# Patient Record
Sex: Female | Born: 1975 | Race: White | Hispanic: No | Marital: Single | Smoking: Never smoker
Health system: Southern US, Community
[De-identification: ages and names within clinical notes are randomized; demographics above are authoritative.]

## PROBLEM LIST (undated history)

## (undated) ENCOUNTER — Emergency Department: Discharge status: Absent without leave | Payer: Self-pay

## (undated) DIAGNOSIS — M1990 Unspecified osteoarthritis, unspecified site: Secondary | ICD-10-CM

## (undated) DIAGNOSIS — J45909 Unspecified asthma, uncomplicated: Secondary | ICD-10-CM

---

## 2018-01-21 ENCOUNTER — Encounter: Payer: Self-pay | Admitting: Emergency Medicine

## 2018-01-21 ENCOUNTER — Emergency Department
Admission: EM | Admit: 2018-01-21 | Discharge: 2018-01-21 | Disposition: A | Discharge status: Home / Self Care | Payer: Self-pay | Source: Home / Self Care

## 2018-01-21 DIAGNOSIS — G43009 Migraine without aura, not intractable, without status migrainosus: Secondary | ICD-10-CM

## 2018-01-21 MED ORDER — SUMATRIPTAN SUCCINATE 6 MG/0.5ML ~~LOC~~ SOLN
6.0000 mg | Freq: Once | SUBCUTANEOUS | Status: AC
  Administered 2018-01-21: 6 mg via SUBCUTANEOUS

## 2018-01-21 MED ORDER — SUMATRIPTAN SUCCINATE 100 MG PO TABS: 100.0000 mg | ORAL_TABLET | ORAL | 0 refills | Status: DC | PRN

## 2018-01-21 NOTE — ED Triage Notes (Signed)
Pt c/o migraine that started this am. States she ran out of her imitrex rx last week. Took tramadol and excedrin today with no relief. Denies light sensitivity but has been having N+V.

## 2018-01-22 NOTE — ED Provider Notes (Signed)
[facility]    CSN: [id number] Arrival date & time: 01/21/18  1554     History   Chief Complaint Chief Complaint  Patient presents with  . Headache    HPI Monica Harmon is a 42 y.o. female.   The history is provided by the patient. No language interpreter was used.  Headache  Pain location:  Generalized Radiates to:  Does not radiate Severity currently:  7/10 Onset quality:  Gradual Timing:  Constant Progression:  Worsening Chronicity:  New Context: not activity   Relieved by:  Nothing Worsened by:  Nothing Associated symptoms: no abdominal pain, no blurred vision, no congestion, no facial pain, no fatigue and no fever     History reviewed. No pertinent past medical history.  There are no active problems to display for this patient.   History reviewed. No pertinent surgical history.  OB History    No data available       Home Medications    Prior to Admission medications   Medication Sig Start Date End Date Taking? Authorizing Provider  SUMAtriptan (IMITREX) 100 MG tablet Take 1 tablet (100 mg total) by mouth every 2 (two) hours as needed for migraine. May repeat in 2 hours if headache persists or recurs. 01/21/18   Elson Areas, PA-C    Family History History reviewed. No pertinent family history.  Social History Social History   Tobacco Use  . Smoking status: Never Smoker  . Smokeless tobacco: Never Used  Substance Use Topics  . Alcohol use: Not on file  . Drug use: Not on file     Allergies   Dexamethasone   Review of Systems Review of Systems  Constitutional: Negative for fatigue and fever.  HENT: Negative for congestion.   Eyes: Negative for blurred vision.  Gastrointestinal: Negative for abdominal pain.  Neurological: Positive for headaches.  All other systems reviewed and are negative.    Physical Exam Triage Vital Signs ED Triage Vitals  Enc Vitals Group     BP 01/21/18 1645 (!) 132/91     Pulse Rate  01/21/18 1645 78     Resp --      Temp 01/21/18 1645 97.8 F (36.6 C)     Temp Source 01/21/18 1645 Oral     SpO2 01/21/18 1645 98 %     Weight 01/21/18 1646 160 lb (72.6 kg)     Height --      Head Circumference --      Peak Flow --      Pain Score 01/21/18 1645 10     Pain Loc --      Pain Edu? --      Excl. in GC? --    No data found.  Updated Vital Signs BP (!) 132/91 (BP Location: Right Arm)   Pulse 78   Temp 97.8 F (36.6 C) (Oral)   Wt 160 lb (72.6 kg)   SpO2 98%   Visual Acuity Right Eye Distance:   Left Eye Distance:   Bilateral Distance:    Right Eye Near:   Left Eye Near:    Bilateral Near:     Physical Exam  Constitutional: She appears well-developed and well-nourished. No distress.  HENT:  Head: Normocephalic and atraumatic.  Eyes: Conjunctivae and EOM are normal. Pupils are equal, round, and reactive to light.  Neck: Normal range of motion. Neck supple.  Cardiovascular: Normal rate and regular rhythm.  No murmur heard. Pulmonary/Chest: Effort normal and breath sounds normal. No  respiratory distress.  Abdominal: Soft. There is no tenderness.  Musculoskeletal: She exhibits no edema.  Neurological: She is alert.  Skin: Skin is warm and dry.  Psychiatric: She has a normal mood and affect.  Nursing note and vitals reviewed.    UC Treatments / Results  Labs (all labs ordered are listed, but only abnormal results are displayed) Labs Reviewed - No data to display  EKG  EKG Interpretation None       Radiology No results found.  Procedures Procedures (including critical care time)  Medications Ordered in UC Medications  SUMAtriptan (IMITREX) injection 6 mg (6 mg Subcutaneous Given 01/21/18 1706)     Initial Impression / Assessment and Plan / UC Course  I have reviewed the triage vital signs and the nursing notes.  Pertinent labs & imaging results that were available during my care of the patient were reviewed by me and considered in my  medical decision making (see chart for details).     Pt request imitrex.  Pt given injection here.  Pt given rx for imitrex tablets.   Final Clinical Impressions(s) / UC Diagnoses   Final diagnoses:  Migraine without aura and without status migrainosus, not intractable    ED Discharge Orders        Ordered    SUMAtriptan (IMITREX) 100 MG tablet  Every 2 hours PRN     01/21/18 1658       Controlled Substance Prescriptions [place] Controlled Substance Registry consulted? Not Applicable  An After Visit Summary was printed and given to the patient.    Elson AreasSofia, Leslie K, [place] JerseyPA-C 01/22/18 [id number]

## 2018-03-13 ENCOUNTER — Emergency Department
Admission: EM | Admit: 2018-03-13 | Discharge: 2018-03-13 | Disposition: A | Discharge status: Home / Self Care | Payer: Self-pay | Source: Home / Self Care | Attending: Family Medicine | Admitting: Family Medicine

## 2018-03-13 ENCOUNTER — Other Ambulatory Visit: Payer: Self-pay

## 2018-03-13 ENCOUNTER — Encounter: Payer: Self-pay | Admitting: Emergency Medicine

## 2018-03-13 DIAGNOSIS — G43009 Migraine without aura, not intractable, without status migrainosus: Secondary | ICD-10-CM

## 2018-03-13 MED ORDER — SUMATRIPTAN SUCCINATE 100 MG PO TABS: 100.0000 mg | ORAL_TABLET | ORAL | 0 refills | Status: AC | PRN

## 2018-03-13 NOTE — ED Triage Notes (Signed)
Pt c/o of migrane that started this morning. She states she is out of her Imitrex for the month of May and will see her Neuro dr in June. She complains of light sensitivity and has been having n+v.

## 2018-03-13 NOTE — ED Provider Notes (Signed)
[name]    CSN: [id number] Arrival date & time: 03/13/18  1137     History   Chief Complaint Chief Complaint  Patient presents with  . Migraine    nausea and vomiting    HPI Monica Harmon is a 42 y.o. female.   HPI  Monica Harmon is a 42 y.o. female presenting to UC with c/o worsening HA c/w prior migraines.  Pain is aching and throbbing in the Right frontal portion, same location as most migraines. Pain is moderate to severe. Associated nausea, vomiting and photophobia. Pt reports vomiting about 10 times last night prior to HA starting this morning.  Her daughter also had GI symptoms recently. No fever or chills. Denies change in vision, numbness or weakness in arms or legs.  She typically takes Imitrex but has run out of her prescription and cannot follow up with Neurology until June.   History reviewed. No pertinent past medical history.  There are no active problems to display for this patient.   History reviewed. No pertinent surgical history.  OB History   None      Home Medications    Prior to Admission medications   Medication Sig Start Date End Date Taking? Authorizing Provider  sulfaSALAzine (AZULFIDINE) 500 MG tablet Take 500 mg by mouth 4 (four) times daily.   Yes [provider]  SUMAtriptan (IMITREX) 100 MG tablet Take 1 tablet (100 mg total) by mouth every 2 (two) hours as needed for migraine. May repeat in 2 hours if headache persists or recurs. 03/13/18   Lurene Shadow, PA-C    Family History History reviewed. No pertinent family history.  Social History Social History   Tobacco Use  . Smoking status: Never Smoker  . Smokeless tobacco: Never Used  Substance Use Topics  . Alcohol use: Not Currently  . Drug use: Not Currently     Allergies   Dexamethasone   Review of Systems Review of Systems  Constitutional: Negative for chills and fever.  HENT: Negative for congestion and sinus pressure.   Respiratory:  Negative for cough.   Gastrointestinal: Positive for nausea and vomiting. Negative for diarrhea.  Musculoskeletal: Negative for arthralgias, myalgias, neck pain and neck stiffness.  Skin: Negative for rash.  Neurological: Positive for headaches. Negative for dizziness and light-headedness.     Physical Exam Triage Vital Signs ED Triage Vitals [03/13/18 1211]  Enc Vitals Group     BP 112/76     Pulse Rate 98     Resp      Temp 98.7 F (37.1 C)     Temp Source Oral     SpO2 99 %     Weight 160 lb (72.6 kg)     Height  (1.702 m)     Head Circumference      Peak Flow      Pain Score 8     Pain Loc      Pain Edu?      Excl. in GC?    No data found.  Updated Vital Signs BP 112/76 (BP Location: Right Arm)   Pulse 98   Temp 98.7 F (37.1 C) (Oral)   Ht  (1.702 m)   Wt 160 lb (72.6 kg)   SpO2 99%   BMI 25.06 kg/m   Visual Acuity Right Eye Distance:   Left Eye Distance:   Bilateral Distance:    Right Eye Near:   Left Eye Near:    Bilateral Near:  Physical Exam  Constitutional: She is oriented to person, place, and time. She appears well-developed and well-nourished. No distress.  HENT:  Head: Normocephalic and atraumatic.  Right Ear: Tympanic membrane normal.  Left Ear: Tympanic membrane normal.  Nose: Nose normal. Right sinus exhibits no maxillary sinus tenderness and no frontal sinus tenderness. Left sinus exhibits no maxillary sinus tenderness and no frontal sinus tenderness.  Mouth/Throat: Uvula is midline, oropharynx is clear and moist and mucous membranes are normal.  Eyes: Pupils are equal, round, and reactive to light. EOM are normal. Right eye exhibits no discharge. Left eye exhibits no discharge.  Neck: Normal range of motion.  Cardiovascular: Normal rate.  Pulmonary/Chest: Effort normal.  Musculoskeletal: Normal range of motion.  Neurological: She is alert and oriented to person, place, and time. No cranial nerve deficit.  CN II-XII in  tact. Speech is clear. Alert to person, place and time. Normal coordination. Normal gait.   Skin: Skin is warm and dry. She is not diaphoretic.  Psychiatric: She has a normal mood and affect. Her behavior is normal.  Nursing note and vitals reviewed.    UC Treatments / Results  Labs (all labs ordered are listed, but only abnormal results are displayed) Labs Reviewed - No data to display  EKG None  Radiology No results found.  Procedures Procedures (including critical care time)  Medications Ordered in UC Medications - No data to display  Initial Impression / Assessment and Plan / UC Course  I have reviewed the triage vital signs and the nursing notes.  Pertinent labs & imaging results that were available during my care of the patient were reviewed by me and considered in my medical decision making (see chart for details).     HA c/w prior migraines. No evidence of emergent process taking place at this time. Discussed treatment in UC Pt states she received Sumatriptan last time in UC but it makes her drowsy.  She declined having the treatment in UC because she has to drive back home to [facility]. She is requesting refill so she can take a pill when she gets home.  Home care instructions provided.   Final Clinical Impressions(s) / UC Diagnoses   Final diagnoses:  Migraine without aura and without status migrainosus, not intractable     Discharge Instructions      Please follow up with a primary care provider or establish care with a family medicine provider for ongoing healthcare needs including recurrent migraines and medication refills.  It is recommended you try to follow up with your neurologist as soon as possible to discuss increased frequency of your migraines.     ED Prescriptions    Medication Sig Dispense Auth. Provider   SUMAtriptan (IMITREX) 100 MG tablet Take 1 tablet (100 mg total) by mouth every 2 (two) hours as needed for migraine. May repeat in 2  hours if headache persists or recurs. 6 tablet Lurene Shadow, PA-C     Controlled Substance Prescriptions [place] Controlled Substance Registry consulted? Not Applicable   Rolla Plate 03/13/18 1315

## 2018-03-13 NOTE — Discharge Instructions (Signed)
°  Please follow up with a primary care provider or establish care with a family medicine provider for ongoing healthcare needs including recurrent migraines and medication refills.  It is recommended you try to follow up with your neurologist as soon as possible to discuss increased frequency of your migraines.

## 2019-05-15 ENCOUNTER — Encounter: Payer: Self-pay | Admitting: Emergency Medicine

## 2019-05-15 ENCOUNTER — Other Ambulatory Visit: Payer: Self-pay

## 2019-05-15 ENCOUNTER — Emergency Department (INDEPENDENT_AMBULATORY_CARE_PROVIDER_SITE_OTHER)
Admission: EM | Admit: 2019-05-15 | Discharge: 2019-05-15 | Disposition: A | Discharge status: Home / Self Care | Payer: BC Managed Care – PPO | Source: Home / Self Care | Attending: Family Medicine | Admitting: Family Medicine

## 2019-05-15 DIAGNOSIS — M2550 Pain in unspecified joint: Secondary | ICD-10-CM | POA: Diagnosis not present

## 2019-05-15 HISTORY — DX: Unspecified osteoarthritis, unspecified site: M19.90

## 2019-05-15 NOTE — ED Provider Notes (Signed)
[facility]    CSN: [id number] Arrival date & time: 05/15/19  1116     History   Chief Complaint Chief Complaint  Patient presents with  . Rheumatoid Arthritis    HPI Monica Harmon is a 43 y.o. female.   Patient has a history of polyarthralgias and inflammatory arthritis.  She has had a flare-up over the past two days and has not felt well, but denies other symptoms.  Her employer has requested that she be evaluated before returning to work.  The history is provided by the patient.    Past Medical History:  Diagnosis Date  . Arthritis     Active problems:  Inflammatory arthrigis   History reviewed. No pertinent surgical history.  OB History   No obstetric history on file.      Home Medications    Prior to Admission medications   Medication Sig Start Date End Date Taking? Authorizing Provider  folic acid (FOLVITE) 1 MG tablet Take 1 mg by mouth daily.   Yes [provider]  methotrexate (RHEUMATREX) 5 MG tablet Take 5 mg by mouth once a week. Caution: Chemotherapy. Protect from light.   Yes [provider]  sulfaSALAzine (AZULFIDINE) 500 MG tablet Take 500 mg by mouth 4 (four) times daily.    [provider]  SUMAtriptan (IMITREX) 100 MG tablet Take 1 tablet (100 mg total) by mouth every 2 (two) hours as needed for migraine. May repeat in 2 hours if headache persists or recurs. 03/13/18   Lurene ShadowPhelps, Erin O, PA-C    Family History Reviewed history from past notes and no changes required.   Social History Social History   Tobacco Use  . Smoking status: Never Smoker  . Smokeless tobacco: Never Used  Substance Use Topics  . Alcohol use: Not Currently  . Drug use: Not Currently     Allergies   Dexamethasone   Review of Systems Review of Systems No sore throat No cough No pleuritic pain No wheezing No nasal congestion No post-nasal drainage No sinus pain/pressure No itchy/red eyes No earache No hemoptysis  No SOB No fever/chills No nausea No vomiting No abdominal pain No diarrhea No urinary symptoms No skin rash No fatigue No myalgias + arthralgias No headache    Physical Exam Triage Vital Signs ED Triage Vitals  Enc Vitals Group     BP 05/15/19 1306 103/72     Pulse Rate 05/15/19 1306 77     Resp --      Temp 05/15/19 1306 97.7 F (36.5 C)     Temp Source 05/15/19 1306 Oral     SpO2 05/15/19 1306 99 %     Weight 05/15/19 1307 145 lb (65.8 kg)     Height 05/15/19 1307 5\' 7"  (1.702 m)     Head Circumference --      Peak Flow --      Pain Score 05/15/19 1307 3     Pain Loc --      Pain Edu? --      Excl. in GC? --    No data found.  Updated Vital Signs BP 103/72 (BP Location: Right Arm)   Pulse 77   Temp 97.7 F (36.5 C) (Oral)   Ht 5\' 7"  (1.702 m)   Wt 65.8 kg   SpO2 99%   BMI 22.71 kg/m   Visual Acuity Right Eye Distance:   Left Eye Distance:   Bilateral Distance:    Right Eye Near:   Left  Eye Near:    Bilateral Near:     Physical Exam Nursing notes and Vital Signs reviewed. Appearance:  Patient appears stated age, and in no acute distress Eyes:  Pupils are equal, round, and reactive to light and accomodation.  Extraocular movement is intact.  Conjunctivae are not inflamed  Ears:  Canals normal.  Tympanic membranes normal.  Nose:   Normal turbinates.  No sinus tenderness. Pharynx:  Normal Neck:  Supple. No adenopathy. Lungs:  Clear to auscultation.  Breath sounds are equal.  Moving air well. Heart:  Regular rate and rhythm without murmurs, rubs, or gallops.  Abdomen:  Nontender without masses or hepatosplenomegaly.  Bowel sounds are present.  No CVA or flank tenderness.  Extremities:  No edema.  Skin:  No rash present.    UC Treatments / Results  Labs (all labs ordered are listed, but only abnormal results are displayed) Labs Reviewed - No data to display  EKG   Radiology No results found.  Procedures Procedures (including critical care  time)  Medications Ordered in UC Medications - No data to display  Initial Impression / Assessment and Plan / UC Course  I have reviewed the triage vital signs and the nursing notes.  Pertinent labs & imaging results that were available during my care of the patient were reviewed by me and considered in my medical decision making (see chart for details).    No symptoms of COVID19.  May resume work.   Final Clinical Impressions(s) / UC Diagnoses   Final diagnoses:  Polyarthralgia   Discharge Instructions   None    ED Prescriptions    None        Kandra Nicolas, MD 05/16/19 1444

## 2019-05-15 NOTE — ED Triage Notes (Signed)
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

## 2019-06-25 ENCOUNTER — Encounter: Payer: Self-pay | Admitting: Emergency Medicine

## 2019-06-25 ENCOUNTER — Emergency Department (INDEPENDENT_AMBULATORY_CARE_PROVIDER_SITE_OTHER)
Admission: EM | Admit: 2019-06-25 | Discharge: 2019-06-25 | Disposition: A | Discharge status: Home / Self Care | Payer: BC Managed Care – PPO | Source: Home / Self Care | Attending: Family Medicine | Admitting: Family Medicine

## 2019-06-25 ENCOUNTER — Other Ambulatory Visit: Payer: Self-pay

## 2019-06-25 DIAGNOSIS — R05 Cough: Secondary | ICD-10-CM | POA: Diagnosis not present

## 2019-06-25 DIAGNOSIS — J069 Acute upper respiratory infection, unspecified: Secondary | ICD-10-CM

## 2019-06-25 DIAGNOSIS — R059 Cough, unspecified: Secondary | ICD-10-CM

## 2019-06-25 MED ORDER — BENZONATATE 200 MG PO CAPS: ORAL_CAPSULE | ORAL | 0 refills | Status: DC

## 2019-06-25 NOTE — ED Triage Notes (Signed)
Cough, body aches, headache, runny nose x 2days, had COVID in April and says she feels the same

## 2019-06-25 NOTE — Discharge Instructions (Addendum)
Take plain guaifenesin (1257m extended release tabs such as Mucinex) twice daily, with plenty of water, for cough and congestion.  May add Pseudoephedrine (323m one or two every 4 to 6 hours) if needed for sinus congestion.  Get adequate rest.   May use Afrin nasal spray (or generic oxymetazoline) each morning for about 5 days and then discontinue.  Also recommend using saline nasal spray several times daily and saline nasal irrigation (AYR is a common brand).  Use Flonase nasal spray each morning after using Afrin nasal spray and saline nasal irrigation. Try warm salt water gargles for sore throat.  Stop all antihistamines for now, and other non-prescription cough/cold preparations. May take Ibuprofen 20048m4 tabs every 8 hours with food for chest/sternum discomfort, body aches, fever, etc. May take Delsym Cough Suppressant with Tessalon at bedtime if needed for cough.   If symptoms become significantly worse during the night or over the weekend, proceed to the local emergency room.   If COVID19 test is positive, isolate yourself until the below conditions are met: 1)  At least 7 days since symptoms onset. AND 2)  > 72 hours after symptom resolution (absence of fever without the use of fever-reducing medicine, and improvement in respiratory symptoms.

## 2019-06-25 NOTE — ED Provider Notes (Signed)
[facility]    CSN: [id number] Arrival date & time: 06/25/19  1324     History   Chief Complaint Chief Complaint  Patient presents with  . Cough    HPI Monica Harmon is a 43 y.o. female.   Patient complains of two day history of typical cold-like symptoms including sinus congestion, headache, fatigue, and cough, but no sore throat.  She denies shortness of breath or changes in taste/smell.  She feels tight in her anterior chest.  She has felt cold but denies fever. She states that she had a positive COVID19 test in April.  At that time she lost her taste and smell.  The history is provided by the patient.    Past Medical History:  Diagnosis Date  . Arthritis     There are no active problems to display for this patient.   History reviewed. No pertinent surgical history.  OB History   No obstetric history on file.      Home Medications    Prior to Admission medications   Medication Sig Start Date End Date Taking? Authorizing Provider  benzonatate (TESSALON) 200 MG capsule Take one cap by mouth at bedtime as needed for cough.  May repeat in 4 to 6 hours 06/25/19   Lattie HawBeese, Shakema Surita A, MD  folic acid (FOLVITE) 1 MG tablet Take 1 mg by mouth daily.    [provider]  methotrexate (RHEUMATREX) 5 MG tablet Take 5 mg by mouth once a week. Caution: Chemotherapy. Protect from light.    [provider]  sulfaSALAzine (AZULFIDINE) 500 MG tablet Take 500 mg by mouth 4 (four) times daily.    [provider]  SUMAtriptan (IMITREX) 100 MG tablet Take 1 tablet (100 mg total) by mouth every 2 (two) hours as needed for migraine. May repeat in 2 hours if headache persists or recurs. 03/13/18   Lurene ShadowPhelps, Erin O, PA-C    Family History History reviewed. No pertinent family history.  Social History Social History   Tobacco Use  . Smoking status: Never Smoker  . Smokeless tobacco: Never Used  Substance Use Topics  . Alcohol use: Not Currently   . Drug use: Not Currently     Allergies   Dexamethasone   Review of Systems Review of Systems No sore throat + cough No pleuritic pain but feels tight in anterior chest No wheezing + nasal congestion + post-nasal drainage No sinus pain/pressure No itchy/red eyes No earache No hemoptysis No SOB No fever/chills but has felt cold + nausea No vomiting No abdominal pain No diarrhea No urinary symptoms No skin rash + fatigue + myalgias + headache Used OTC meds without relief   Physical Exam Triage Vital Signs ED Triage Vitals  Enc Vitals Group     BP 06/25/19 1359 122/86     Pulse Rate 06/25/19 1359 (!) 109     Resp --      Temp 06/25/19 1359 98.6 F (37 C)     Temp Source 06/25/19 1359 Oral     SpO2 06/25/19 1359 98 %     Weight 06/25/19 1400 145 lb (65.8 kg)     Height 06/25/19 1400 5\' 7"  (1.702 m)     Head Circumference --      Peak Flow --      Pain Score 06/25/19 1400 3     Pain Loc --      Pain Edu? --      Excl. in GC? --  No data found.  Updated Vital Signs BP 122/86 (BP Location: Right Arm)   Pulse (!) 109   Temp 98.6 F (37 C) (Oral)   Ht 5\' 7"  (1.702 m)   Wt 65.8 kg   SpO2 98%   BMI 22.71 kg/m   Visual Acuity Right Eye Distance:   Left Eye Distance:   Bilateral Distance:    Right Eye Near:   Left Eye Near:    Bilateral Near:     Physical Exam Nursing notes and Vital Signs reviewed. Appearance:  Patient appears stated age, and in no acute distress Eyes:  Pupils are equal, round, and reactive to light and accomodation.  Extraocular movement is intact.  Conjunctivae are not inflamed  Ears:  Canals normal.  Tympanic membranes normal.  Nose:  Mildly congested turbinates.  No sinus tenderness.    Pharynx:  Normal Neck:  Supple.  Enlarged posterior/lateral nodes are palpated bilaterally, tender to palpation on the right.   Lungs:  Clear to auscultation.  Breath sounds are equal.  Moving air well. Chest:  Distinct tenderness to  palpation over the mid-sternum.  Heart:  Regular rate and rhythm without murmurs, rubs, or gallops.  Abdomen:  Nontender without masses or hepatosplenomegaly.  Bowel sounds are present.  No CVA or flank tenderness.  Extremities:  No edema.  Skin:  No rash present.    UC Treatments / Results  Labs (all labs ordered are listed, but only abnormal results are displayed) Labs Reviewed  NOVEL CORONAVIRUS, NAA    EKG   Radiology No results found.  Procedures Procedures (including critical care time)  Medications Ordered in UC Medications - No data to display  Initial Impression / Assessment and Plan / UC Course  I have reviewed the triage vital signs and the nursing notes.  Pertinent labs & imaging results that were available during my care of the patient were reviewed by me and considered in my medical decision making (see chart for details).    Suspect typical viral URI rather than COVID19 (note that patient has normal taste and smell).  COVID19 test pending. Treat symptomatically for now. Prescription written for Benzonatate [facility](Tessalon) to take at bedtime for night-time cough. Followup with Family Doctor if not improved in one week.    Final Clinical Impressions(s) / UC Diagnoses   Final diagnoses:  Cough  Viral URI with cough     Discharge Instructions     Take plain guaifenesin (1200mg  extended release tabs such as Mucinex) twice daily, with plenty of water, for cough and congestion.  May add Pseudoephedrine (30mg , one or two every 4 to 6 hours) if needed for sinus congestion.  Get adequate rest.   May use Afrin nasal spray (or generic oxymetazoline) each morning for about 5 days and then discontinue.  Also recommend using saline nasal spray several times daily and saline nasal irrigation (AYR is a common brand).  Use Flonase nasal spray each morning after using Afrin nasal spray and saline nasal irrigation. Try warm salt water gargles for sore throat.  Stop all  antihistamines for now, and other non-prescription cough/cold preparations. May take Ibuprofen 200mg , 4 tabs every 8 hours with food for chest/sternum discomfort, body aches, fever, etc. May take Delsym Cough Suppressant with Tessalon at bedtime if needed for cough.   If symptoms become significantly worse during the night or over the weekend, proceed to the local emergency room.     ED Prescriptions    Medication Sig Dispense Auth. Provider  benzonatate (TESSALON) 200 MG capsule Take one cap by mouth at bedtime as needed for cough.  May repeat in 4 to 6 hours 15 capsule Assunta Found Ishmael Holter, MD         Kandra Nicolas, MD 06/26/19 [phone]

## 2019-06-26 ENCOUNTER — Telehealth: Payer: Self-pay

## 2019-06-26 LAB — NOVEL CORONAVIRUS, NAA: SARS-CoV-2, NAA: NOT DETECTED

## 2019-06-26 NOTE — Telephone Encounter (Signed)
Notified patient of Neg Covid results by VM.  Contact information given.

## 2019-07-01 ENCOUNTER — Emergency Department (INDEPENDENT_AMBULATORY_CARE_PROVIDER_SITE_OTHER)
Admission: EM | Admit: 2019-07-01 | Discharge: 2019-07-01 | Disposition: A | Discharge status: Home / Self Care | Payer: BC Managed Care – PPO | Source: Home / Self Care | Attending: Family Medicine | Admitting: Family Medicine

## 2019-07-01 ENCOUNTER — Other Ambulatory Visit: Payer: Self-pay

## 2019-07-01 DIAGNOSIS — R05 Cough: Secondary | ICD-10-CM

## 2019-07-01 DIAGNOSIS — R053 Chronic cough: Secondary | ICD-10-CM

## 2019-07-01 MED ORDER — AZITHROMYCIN 250 MG PO TABS: ORAL_TABLET | ORAL | 0 refills | Status: DC

## 2019-07-01 MED ORDER — PREDNISONE 20 MG PO TABS: ORAL_TABLET | ORAL | 0 refills | Status: DC

## 2019-07-01 MED ORDER — GUAIFENESIN-CODEINE 100-10 MG/5ML PO SOLN: ORAL | 0 refills | Status: DC

## 2019-07-01 NOTE — ED Provider Notes (Signed)
[facility]    CSN: [id number] Arrival date & time: 07/01/19  1431      History   Chief Complaint Chief Complaint  Patient presents with  . Cough    HPI Monica Harmon is a 43 y.o. female.   Patient was evaluated and treated for a viral URI 6 days ago.  She complains of a persistent non-productive cough without shortness of breath or pleuritic pain.  She often coughs until she gags.  She denies fevers, chills, and sweats and feels well otherwise.  She had asthma as a child and notes that her respiratory infections often linger.  She denies wheezing. She had a positive COVID19 test 4 months ago.  She states that her Tdap is current.  The history is provided by the patient.    Past Medical History:  Diagnosis Date  . Arthritis     There are no active problems to display for this patient.   History reviewed. No pertinent surgical history.  OB History   No obstetric history on file.      Home Medications    Prior to Admission medications   Medication Sig Start Date End Date Taking? Authorizing Provider  leflunomide (ARAVA) 10 MG tablet Take 10 mg by mouth daily.   Yes [provider]  nortriptyline (PAMELOR) 10 MG/5ML solution Take by mouth at bedtime.   Yes [provider]  azithromycin (ZITHROMAX Z-PAK) 250 MG tablet Take 2 tabs today; then begin one tab once daily for 4 more days. 07/01/19   Kandra Nicolas, MD  benzonatate (TESSALON) 200 MG capsule Take one cap by mouth at bedtime as needed for cough.  May repeat in 4 to 6 hours 06/25/19   Kandra Nicolas, MD  folic acid (FOLVITE) 1 MG tablet Take 1 mg by mouth daily.    [provider]  guaiFENesin-codeine 100-10 MG/5ML syrup Take 58mL by mouth at bedtime as needed for cough. 07/01/19   Kandra Nicolas, MD  methotrexate (RHEUMATREX) 5 MG tablet Take 5 mg by mouth once a week. Caution: Chemotherapy. Protect from light.    [provider]  predniSONE (DELTASONE) 20  MG tablet Take one tab by mouth twice daily for 4 days, then one daily. Take with food. 07/01/19   Kandra Nicolas, MD  sulfaSALAzine (AZULFIDINE) 500 MG tablet Take 500 mg by mouth 4 (four) times daily.    [provider]  SUMAtriptan (IMITREX) 100 MG tablet Take 1 tablet (100 mg total) by mouth every 2 (two) hours as needed for migraine. May repeat in 2 hours if headache persists or recurs. 03/13/18   Noe Gens, PA-C    Family History History reviewed. No pertinent family history.  Social History Social History   Tobacco Use  . Smoking status: Never Smoker  . Smokeless tobacco: Never Used  Substance Use Topics  . Alcohol use: Not Currently  . Drug use: Not Currently     Allergies   Dexamethasone   Review of Systems Review of Systems No sore throat + cough No pleuritic pain No wheezing No nasal congestion No post-nasal drainage No sinus pain/pressure No itchy/red eyes No earache No hemoptysis No SOB No fever/chills No nausea No vomiting No abdominal pain No diarrhea No urinary symptoms No skin rash No fatigue No myalgias No headache    Physical Exam Triage Vital Signs ED Triage Vitals  Enc Vitals Group     BP 07/01/19 1507 113/79     Pulse Rate  07/01/19 1507 92     Resp 07/01/19 1507 20     Temp 07/01/19 1507 98.2 F (36.8 C)     Temp Source 07/01/19 1507 Oral     SpO2 07/01/19 1507 98 %     Weight 07/01/19 1509 145 lb (65.8 kg)     Height 07/01/19 1509 5\' 7"  (1.702 m)     Head Circumference --      Peak Flow --      Pain Score 07/01/19 1509 0     Pain Loc --      Pain Edu? --      Excl. in [place]? --    No data found.  Updated Vital Signs BP 113/79 (BP Location: Right Arm)   Pulse 92   Temp 98.2 F (36.8 C) (Oral)   Resp 20   Ht 5\' 7"  (1.702 m)   Wt 65.8 kg   SpO2 98%   BMI 22.71 kg/m   Visual Acuity Right Eye Distance:   Left Eye Distance:   Bilateral Distance:    Right Eye Near:   Left Eye Near:    Bilateral Near:      Physical Exam Nursing notes and Vital Signs reviewed. Appearance:  Patient appears stated age, and in no acute distress Eyes:  Pupils are equal, round, and reactive to light and accomodation.  Extraocular movement is intact.  Conjunctivae are not inflamed  Ears:  Canals normal.  Tympanic membranes normal.  Nose: Normal turbinates.  No sinus tenderness. Pharynx:  Normal Neck:  Supple. No adenopathy. Lungs:  Clear to auscultation.  Breath sounds are equal.  Moving air well. Heart:  Regular rate and rhythm without murmurs, rubs, or gallops.  Abdomen:  Nontender without masses or hepatosplenomegaly.  Bowel sounds are present.  No CVA or flank tenderness.  Extremities:  No edema.  Skin:  No rash present.    UC Treatments / Results  Labs (all labs ordered are listed, but only abnormal results are displayed) Labs Reviewed - No data to display  EKG   Radiology No results found.  Procedures Procedures (including critical care time)  Medications Ordered in UC Medications - No data to display  Initial Impression / Assessment and Plan / UC Course  I have reviewed the triage vital signs and the nursing notes.  Pertinent labs & imaging results that were available during my care of the patient were reviewed by me and considered in my medical decision making (see chart for details).    Begin empiric Z-pak for atypical coverage and prednisone burst/taper. Followup with Family Doctor if not improved in one week.  Rx for Robitussin AC for night time cough.  Controlled Substance Prescriptions I have consulted the [place] Controlled Substances Registry for this patient, and feel the risk/benefit ratio today is favorable for proceeding with this prescription for a controlled substance.  Followup with Family Doctor if not improved in one week.    Final Clinical Impressions(s) / UC Diagnoses   Final diagnoses:  Persistent dry cough     Discharge Instructions     Take plain guaifenesin  (1200mg  extended release tabs such as Mucinex) twice daily, with plenty of water, for cough and congestion.       ED Prescriptions    Medication Sig Dispense Auth. Provider   azithromycin (ZITHROMAX Z-PAK) 250 MG tablet Take 2 tabs today; then begin one tab once daily for 4 more days. 6 tablet Lattie HawBeese, Stephen A, MD   predniSONE (DELTASONE) 20 MG tablet  Take one tab by mouth twice daily for 4 days, then one daily. Take with food. 12 tablet Lattie Haw, MD   guaiFENesin-codeine 100-10 MG/5ML syrup Take 4mL by mouth at bedtime as needed for cough. 50 mL Lattie Haw, MD        Lattie Haw, MD 07/03/19 [phone]

## 2019-07-01 NOTE — Discharge Instructions (Addendum)
Take plain guaifenesin (1200mg extended release tabs such as Mucinex) twice daily, with plenty of water, for cough and congestion.   °  °

## 2019-07-01 NOTE — ED Triage Notes (Signed)
Cough is much worse than last week.  Coughs to the point of vomiting.

## 2019-10-20 ENCOUNTER — Encounter: Payer: Self-pay | Admitting: Emergency Medicine

## 2019-10-20 ENCOUNTER — Emergency Department (INDEPENDENT_AMBULATORY_CARE_PROVIDER_SITE_OTHER)
Admission: EM | Admit: 2019-10-20 | Discharge: 2019-10-20 | Disposition: A | Discharge status: Home / Self Care | Payer: BC Managed Care – PPO | Source: Home / Self Care | Attending: Family Medicine | Admitting: Family Medicine

## 2019-10-20 ENCOUNTER — Other Ambulatory Visit: Payer: Self-pay

## 2019-10-20 DIAGNOSIS — R053 Chronic cough: Secondary | ICD-10-CM

## 2019-10-20 DIAGNOSIS — R05 Cough: Secondary | ICD-10-CM | POA: Diagnosis not present

## 2019-10-20 HISTORY — DX: Unspecified asthma, uncomplicated: J45.909

## 2019-10-20 MED ORDER — ADVAIR HFA 115-21 MCG/ACT IN AERO: 2.0000 | INHALATION_SPRAY | Freq: Two times a day (BID) | RESPIRATORY_TRACT | 1 refills | Status: AC

## 2019-10-20 MED ORDER — PREDNISONE 20 MG PO TABS: ORAL_TABLET | ORAL | 0 refills | Status: AC

## 2019-10-20 NOTE — ED Triage Notes (Signed)
Patient reports intermittent cough since late 8/20; seems to clear temporarily, then return; no fever. She has been tested for covid 11/30 and 12/9 both times negative. History childhood asthma.

## 2019-10-20 NOTE — Discharge Instructions (Addendum)
May take Delsym Cough Suppressant at bedtime for nighttime cough.

## 2019-10-20 NOTE — ED Provider Notes (Signed)
[facility]    CSN: [id number] Arrival date & time: 10/20/19  1220      History   Chief Complaint Chief Complaint  Patient presents with  . Cough    HPI Monica Harmon is a 43 y.o. female.   Patient complains of intermittently recurring periods of cough since August.  She has felt well without pleuritic pain or fever.  She has had negative COVID19 tests on 10/06/19 and 10/15/19. She has a past history of childhood asthma, and numerous maternal relatives with asthma.  The history is provided by the patient.    Past Medical History:  Diagnosis Date  . Arthritis   . Asthma    childhood    There are no problems to display for this patient.   History reviewed. No pertinent surgical history.  OB History   No obstetric history on file.      Home Medications    Prior to Admission medications   Medication Sig Start Date End Date Taking? Authorizing Provider  Etanercept (ENBREL) 25 MG/0.5ML SOLN Inject into the skin.   Yes [provider]  fluticasone-salmeterol (ADVAIR HFA) 115-21 MCG/ACT inhaler Inhale 2 puffs into the lungs 2 (two) times daily. (Use every 12 hours) 10/20/19   Lattie Haw, MD  folic acid (FOLVITE) 1 MG tablet Take 1 mg by mouth daily.    [provider]  leflunomide (ARAVA) 10 MG tablet Take 10 mg by mouth daily.    [provider]  methotrexate (RHEUMATREX) 5 MG tablet Take 5 mg by mouth once a week. Caution: Chemotherapy. Protect from light.    [provider]  nortriptyline (PAMELOR) 10 MG/5ML solution Take by mouth at bedtime.    [provider]  predniSONE (DELTASONE) 20 MG tablet Take one tab by mouth twice daily for 4 days, then one daily. Take with food. 10/20/19   Lattie Haw, MD  sulfaSALAzine (AZULFIDINE) 500 MG tablet Take 500 mg by mouth 4 (four) times daily.    [provider]  SUMAtriptan (IMITREX) 100 MG tablet Take 1 tablet (100 mg total) by mouth every 2  (two) hours as needed for migraine. May repeat in 2 hours if headache persists or recurs. 03/13/18   Lurene Shadow, PA-C    Family History Multiple maternal relatives with asthma  Social History Social History   Tobacco Use  . Smoking status: Never Smoker  . Smokeless tobacco: Never Used  Substance Use Topics  . Alcohol use: Not Currently  . Drug use: Not Currently     Allergies   Dexamethasone   Review of Systems Review of Systems No sore throat + cough No pleuritic pain No wheezing No nasal congestion No post-nasal drainage No sinus pain/pressure No itchy/red eyes No earache No hemoptysis No SOB No fever/chills No nausea No vomiting No abdominal pain No diarrhea No urinary symptoms No skin rash No fatigue No myalgias No headache   Physical Exam Triage Vital Signs ED Triage Vitals [10/20/19 1314]  Enc Vitals Group     BP 119/80     Pulse Rate 91     Resp 20     Temp 98.6 F (37 C)     Temp Source Oral     SpO2 98 %     Weight 143 lb 4.8 oz (65 kg)     Height 5\' 7"  (1.702 m)     Head Circumference      Peak Flow      Pain Score  0     Pain Loc      Pain Edu?      Excl. in [place]?    No data found.  Updated Vital Signs BP 119/80 (BP Location: Right Arm)   Pulse 91   Temp 98.6 F (37 C) (Oral)   Resp 20   Ht 5\' 7"  (1.702 m)   Wt 65 kg   SpO2 98%   BMI 22.44 kg/m   Visual Acuity Right Eye Distance:   Left Eye Distance:   Bilateral Distance:    Right Eye Near:   Left Eye Near:    Bilateral Near:     Physical Exam Nursing notes and Vital Signs reviewed. Appearance:  Patient appears stated age, and in no acute distress Eyes:  Pupils are equal, round, and reactive to light and accomodation.  Extraocular movement is intact.  Conjunctivae are not inflamed  Ears:  Canals normal.  Tympanic membranes normal.  Nose:  Mildly congested turbinates.  No sinus tenderness.  Pharynx:  Normal Neck:  Supple.  No adenopathy   Lungs:  Clear to  auscultation.  Breath sounds are equal.  Moving air well. Heart:  Regular rate and rhythm without murmurs, rubs, or gallops.  Abdomen:  Nontender without masses or hepatosplenomegaly.  Bowel sounds are present.  No CVA or flank tenderness.  Extremities:  No edema.  Skin:  No rash present.   UC Treatments / Results  Labs (all labs ordered are listed, but only abnormal results are displayed) Labs Reviewed - No data to display  EKG   Radiology No results found.  Procedures Procedures (including critical care time)  Medications Ordered in UC Medications - No data to display  Initial Impression / Assessment and Plan / UC Course  I have reviewed the triage vital signs and the nursing notes.  Pertinent labs & imaging results that were available during my care of the patient were reviewed by me and considered in my medical decision making (see chart for details).    There is no evidence of bacterial infection today.   Suspect recurring bronchospasm, with past history of asthma. Begin prednisone burst/taper, concurrently with Advair HFA. Followup with pulmonologist if not improved one month.   Final Clinical Impressions(s) / UC Diagnoses   Final diagnoses:  Persistent cough for 3 weeks or longer     Discharge Instructions     May take Delsym Cough Suppressant at bedtime for nighttime cough.     ED Prescriptions    Medication Sig Dispense Auth. Provider   fluticasone-salmeterol (ADVAIR HFA) 115-21 MCG/ACT inhaler Inhale 2 puffs into the lungs 2 (two) times daily. (Use every 12 hours) 1 Inhaler Kandra Nicolas, MD   predniSONE (DELTASONE) 20 MG tablet Take one tab by mouth twice daily for 4 days, then one daily. Take with food. 12 tablet Kandra Nicolas, MD        Kandra Nicolas, MD 10/22/19 [phone]

## 2019-11-17 ENCOUNTER — Emergency Department
Admission: EM | Admit: 2019-11-17 | Discharge: 2019-11-17 | Disposition: A | Discharge status: Home / Self Care | Payer: BLUE CROSS/BLUE SHIELD | Source: Home / Self Care | Attending: Family Medicine | Admitting: Family Medicine

## 2019-11-17 ENCOUNTER — Emergency Department (INDEPENDENT_AMBULATORY_CARE_PROVIDER_SITE_OTHER): Payer: BLUE CROSS/BLUE SHIELD

## 2019-11-17 ENCOUNTER — Other Ambulatory Visit: Payer: Self-pay

## 2019-11-17 DIAGNOSIS — R509 Fever, unspecified: Secondary | ICD-10-CM

## 2019-11-17 DIAGNOSIS — R05 Cough: Secondary | ICD-10-CM

## 2019-11-17 DIAGNOSIS — J069 Acute upper respiratory infection, unspecified: Secondary | ICD-10-CM

## 2019-11-17 DIAGNOSIS — R059 Cough, unspecified: Secondary | ICD-10-CM

## 2019-11-17 LAB — POCT CBC W AUTO DIFF (K'VILLE URGENT CARE)

## 2019-11-17 MED ORDER — DOXYCYCLINE HYCLATE 100 MG PO CAPS: 100.0000 mg | ORAL_CAPSULE | Freq: Two times a day (BID) | ORAL | 0 refills | Status: AC

## 2019-11-17 NOTE — Discharge Instructions (Addendum)
Take plain guaifenesin (1200mg  extended release tabs such as Mucinex) twice daily, with plenty of water, for cough and congestion. Get adequate rest.   May take Delsym Cough Suppressant at bedtime for nighttime cough.  Stop all antihistamines for now, and other non-prescription cough/cold preparations.   Isolate yourself until COVID-19 test result is available.   If your COVID19 test is positive, then you are infected with the novel coronavirus and could give the virus to others.  Please continue isolation at home for at least 10 days since the start of your symptoms.  Once you complete your 10 day quarantine, you may return to normal activities as long as you've not had a fever for over 24 hours (without taking fever reducing medicine) and your symptoms are improving. Please continue good preventive care measures, including:  frequent hand-washing, avoid touching your face, cover coughs/sneezes, stay out of crowds and keep a 6 foot distance from others.  Go to the nearest hospital emergency room if fever/cough/breathlessness are severe or illness seems like a threat to life.

## 2019-11-17 NOTE — ED Provider Notes (Signed)
[facility]    CSN: [id number] Arrival date & time: 11/17/19  1338      History   Chief Complaint Chief Complaint  Patient presents with  . Cough  . Headache  . Diarrhea  . Emesis  . Facial Pain    HPI Monica Harmon is a 44 y.o. female.   Patient was seen for a persistent cough and bronchospasm about one month ago.  During the past three days she has developed increased cough, nasal congestion, headache, myalgias, fever to 100+, and chills.  She has had several episodes of nausea/vomiting and diarrhea.  She often coughs until she gags.  The history is provided by the patient.    Past Medical History:  Diagnosis Date  . Arthritis   . Asthma    childhood    There are no problems to display for this patient.   History reviewed. No pertinent surgical history.  OB History   No obstetric history on file.      Home Medications    Prior to Admission medications   Medication Sig Start Date End Date Taking? Authorizing Provider  doxycycline (VIBRAMYCIN) 100 MG capsule Take 1 capsule (100 mg total) by mouth 2 (two) times daily. Take with food. 11/17/19   Lattie Haw, MD  Etanercept (ENBREL) 25 MG/0.5ML SOLN Inject into the skin.    [provider]  fluticasone-salmeterol (ADVAIR HFA) 115-21 MCG/ACT inhaler Inhale 2 puffs into the lungs 2 (two) times daily. (Use every 12 hours) 10/20/19   Lattie Haw, MD  folic acid (FOLVITE) 1 MG tablet Take 1 mg by mouth daily.    [provider]  leflunomide (ARAVA) 10 MG tablet Take 10 mg by mouth daily.    [provider]  methotrexate (RHEUMATREX) 5 MG tablet Take 5 mg by mouth once a week. Caution: Chemotherapy. Protect from light.    [provider]  nortriptyline (PAMELOR) 10 MG/5ML solution Take by mouth at bedtime.    [provider]  predniSONE (DELTASONE) 20 MG tablet Take one tab by mouth twice daily for 4 days, then one daily. Take with food. 10/20/19    Lattie Haw, MD  sulfaSALAzine (AZULFIDINE) 500 MG tablet Take 500 mg by mouth 4 (four) times daily.    [provider]  SUMAtriptan (IMITREX) 100 MG tablet Take 1 tablet (100 mg total) by mouth every 2 (two) hours as needed for migraine. May repeat in 2 hours if headache persists or recurs. 03/13/18   Lurene Shadow, PA-C    Family History History reviewed. No pertinent family history.  Social History Social History   Tobacco Use  . Smoking status: Never Smoker  . Smokeless tobacco: Never Used  Substance Use Topics  . Alcohol use: Not Currently  . Drug use: Not Currently     Allergies   Dexamethasone   Review of Systems Review of Systems No sore throat + cough No pleuritic pain No wheezing + nasal congestion + post-nasal drainage No sinus pain/pressure No itchy/red eyes No earache No hemoptysis No SOB + fever, + chills + nausea + vomiting No abdominal pain + diarrhea No urinary symptoms No skin rash + fatigue + myalgias + headache    Physical Exam Triage Vital Signs ED Triage Vitals  Enc Vitals Group     BP 11/17/19 1407 123/79     Pulse Rate 11/17/19 1407 100     Resp 11/17/19 1407 20     Temp 11/17/19 1407 98.6 F (  37 C)     Temp Source 11/17/19 1407 Oral     SpO2 11/17/19 1407 98 %     Weight 11/17/19 1402 159 lb (72.1 kg)     Height 11/17/19 1402 5\' 9"  (1.753 m)     Head Circumference --      Peak Flow --      Pain Score 11/17/19 1402 0     Pain Loc --      Pain Edu? --      Excl. in [place]? --    No data found.  Updated Vital Signs BP 123/79 (BP Location: Left Arm)   Pulse 100   Temp 98.6 F (37 C) (Oral)   Resp 20   Ht 5\' 9"  (1.753 m)   Wt 72.1 kg   SpO2 98%   BMI 23.48 kg/m   Visual Acuity Right Eye Distance:   Left Eye Distance:   Bilateral Distance:    Right Eye Near:   Left Eye Near:    Bilateral Near:     Physical Exam Nursing notes and Vital Signs reviewed. Appearance:  Patient appears stated age, and  in no acute distress Eyes:  Pupils are equal, round, and reactive to light and accomodation.  Extraocular movement is intact.  Conjunctivae are not inflamed  Ears:  Canals normal.  Tympanic membranes normal.  Nose:  Mildly congested turbinates.  No sinus tenderness.  Pharynx:  Normal Neck:  Supple.  Mildly enlarged lateral nodes are present, tender to palpation on the left.   Lungs:  Clear to auscultation.  Breath sounds are equal.  Moving air well. Heart:  Regular rate and rhythm without murmurs, rubs, or gallops.  Abdomen:  Nontender without masses or hepatosplenomegaly.  Bowel sounds are present.  No CVA or flank tenderness.  Extremities:  No edema.  Skin:  No rash present.   UC Treatments / Results  Labs (all labs ordered are listed, but only abnormal results are displayed) Labs Reviewed  POCT CBC W AUTO DIFF ([facility]):  WBC 10.0; LY 37.7; MO 6.8; GR 55.5; Hgb 12.6; Platelets 278   NOVEL CORONAVIRUS, NAA    EKG   Radiology DG Chest 2 View  Result Date: 11/17/2019 CLINICAL DATA:  Cough and fever EXAM: CHEST - 2 VIEW COMPARISON:  September 21, 2016 FINDINGS: Lungs are clear. Heart size and pulmonary vascularity are normal. No adenopathy. No bone lesions. IMPRESSION: Lungs clear.  Stable cardiac silhouette.  No adenopathy. Electronically Signed   By: 01/15/2020 III M.D.   On: 11/17/2019 15:17    Procedures Procedures (including critical care time)  Medications Ordered in UC Medications - No data to display  Initial Impression / Assessment and Plan / UC Course  I have reviewed the triage vital signs and the nursing notes.  Pertinent labs & imaging results that were available during my care of the patient were reviewed by me and considered in my medical decision making (see chart for details).    Normal WBC and chest x-ray reassuring. Because of her prolonged cough, will begin empiric doxycycline. COVID19 send out   Final Clinical Impressions(s) / UC  Diagnoses   Final diagnoses:  Cough  Recurrent upper respiratory infection (URI)     Discharge Instructions     Take plain guaifenesin (1200mg  extended release tabs such as Mucinex) twice daily, with plenty of water, for cough and congestion. Get adequate rest.   May take Delsym Cough Suppressant at bedtime for nighttime cough.  Stop all  antihistamines for now, and other non-prescription cough/cold preparations.   Isolate yourself until COVID-19 test result is available.   If your COVID19 test is positive, then you are infected with the novel coronavirus and could give the virus to others.  Please continue isolation at home for at least 10 days since the start of your symptoms.  Once you complete your 10 day quarantine, you may return to normal activities as long as you've not had a fever for over 24 hours (without taking fever reducing medicine) and your symptoms are improving. Please continue good preventive care measures, including:  frequent hand-washing, avoid touching your face, cover coughs/sneezes, stay out of crowds and keep a 6 foot distance from others.  Go to the nearest hospital emergency room if fever/cough/breathlessness are severe or illness seems like a threat to life.    ED Prescriptions    Medication Sig Dispense Auth. Provider   doxycycline (VIBRAMYCIN) 100 MG capsule Take 1 capsule (100 mg total) by mouth 2 (two) times daily. Take with food. 14 capsule Kandra Nicolas, MD        Kandra Nicolas, MD 11/20/19 1359

## 2019-11-17 NOTE — ED Triage Notes (Signed)
Pt presents to clinic with c/o cough, malaise, headache, nose bleed x4, diarrhea and vomiting. Onset of symptoms 11/14/2019. No otc taken at this time. Pt has been around sick family members but denies known covid exposure. No other concerns at this time.

## 2019-11-18 LAB — NOVEL CORONAVIRUS, NAA: SARS-CoV-2, NAA: NOT DETECTED

## 2021-03-21 IMAGING — DX DG CHEST 2V
2 series · 2 of 2 positions shown · non-contrast
Comparison: September 21, 2016

CLINICAL DATA: Cough and fever

EXAM:
CHEST - 2 VIEW

[chest pa]
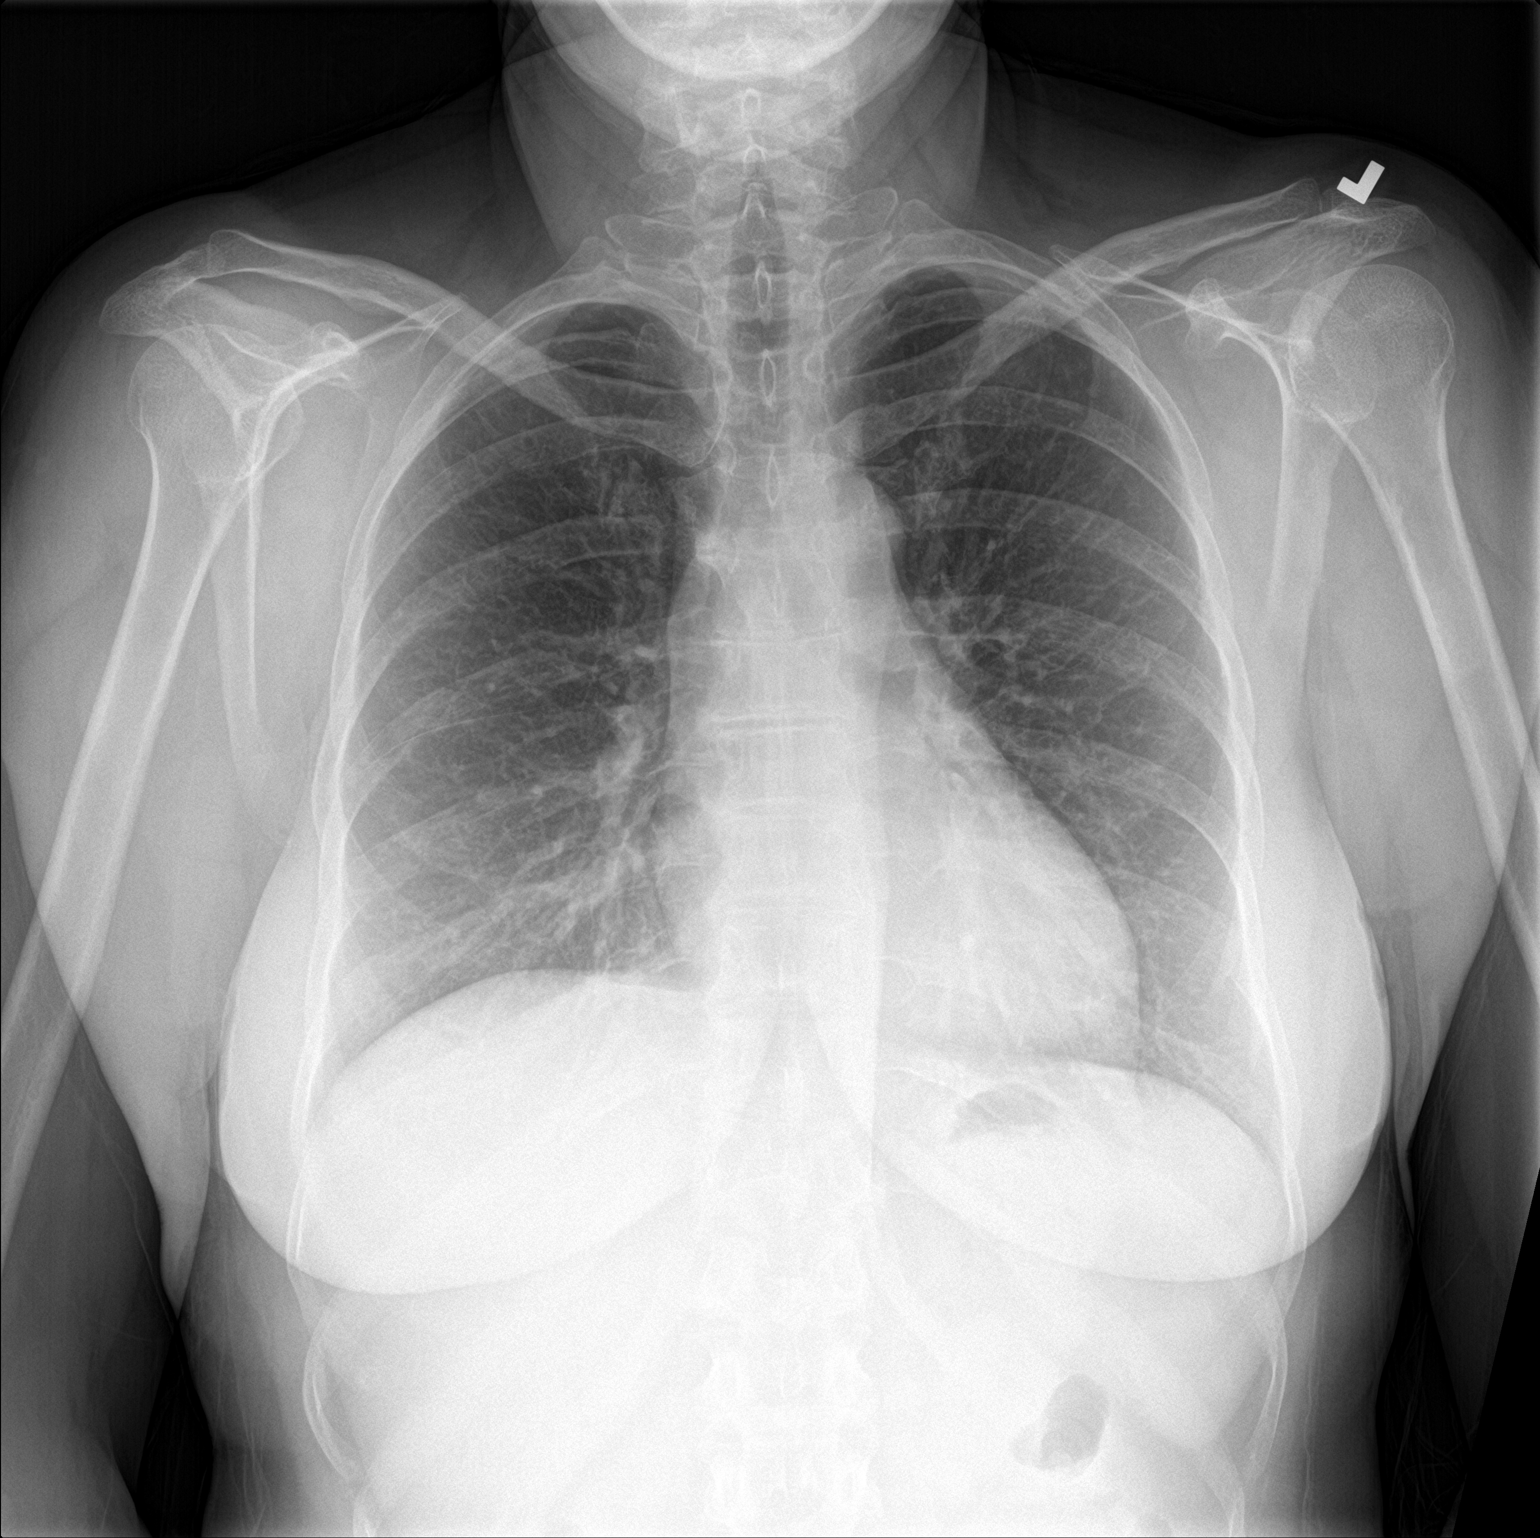

[chest lat]
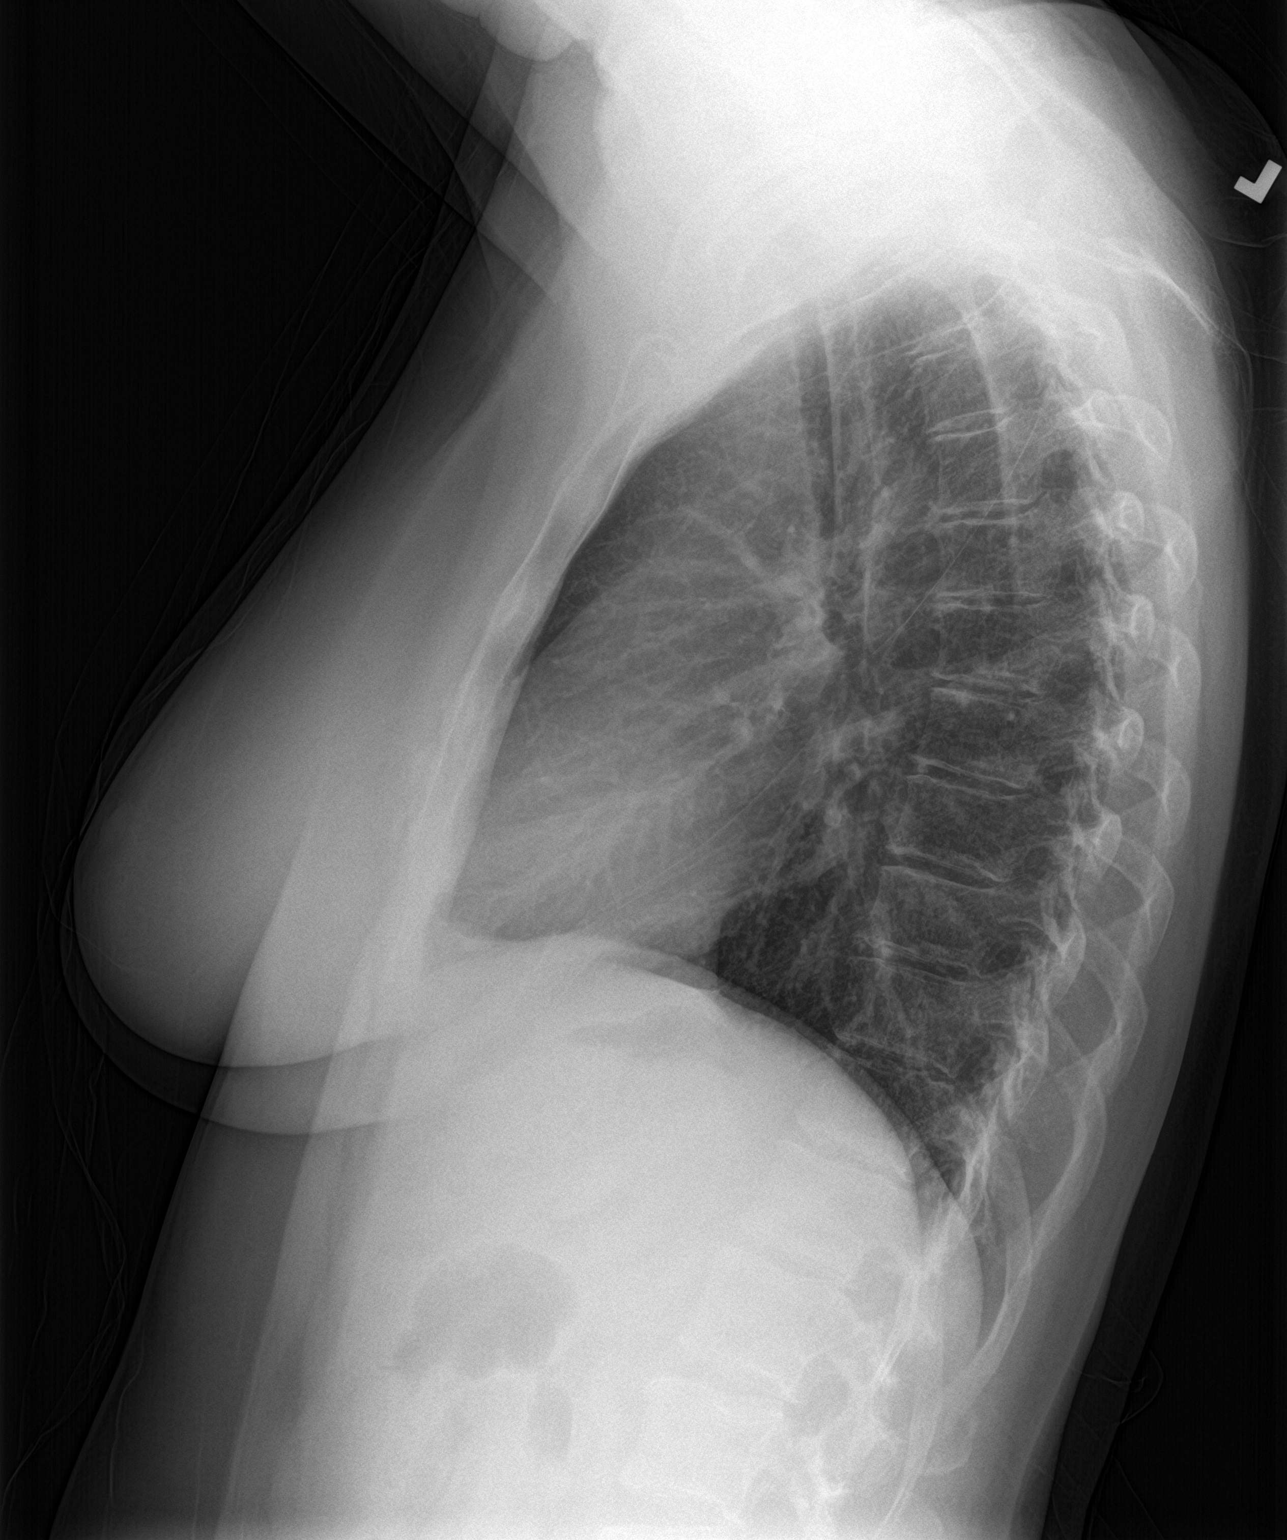

[2 of 2 positions shown; findings below may reference images not displayed]

FINDINGS: Lungs are clear. Heart size and pulmonary vascularity are normal. No
adenopathy. No bone lesions.
IMPRESSION: Lungs clear.  Stable cardiac silhouette.  No adenopathy.
